# Patient Record
Sex: Male | Born: 1990 | Race: White | Hispanic: No | Marital: Single | Smoking: Current every day smoker
Health system: Southern US, Community
[De-identification: ages and names within clinical notes are randomized; demographics above are authoritative.]

## PROBLEM LIST (undated history)

## (undated) DIAGNOSIS — R011 Cardiac murmur, unspecified: Secondary | ICD-10-CM

---
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
## 2011-04-29 IMAGING — CR DG ANKLE COMPLETE 3+V*R*
3 series · 3 of 3 positions shown · non-contrast
Comparison: None.

CLINICAL DATA: Injury to right ankle while playing basketball;
right lateral malleolar swelling and pain at the distal aspect of
the right lower leg.

RIGHT ANKLE - COMPLETE 3+ VIEW

[view not recorded (1 of 3)]
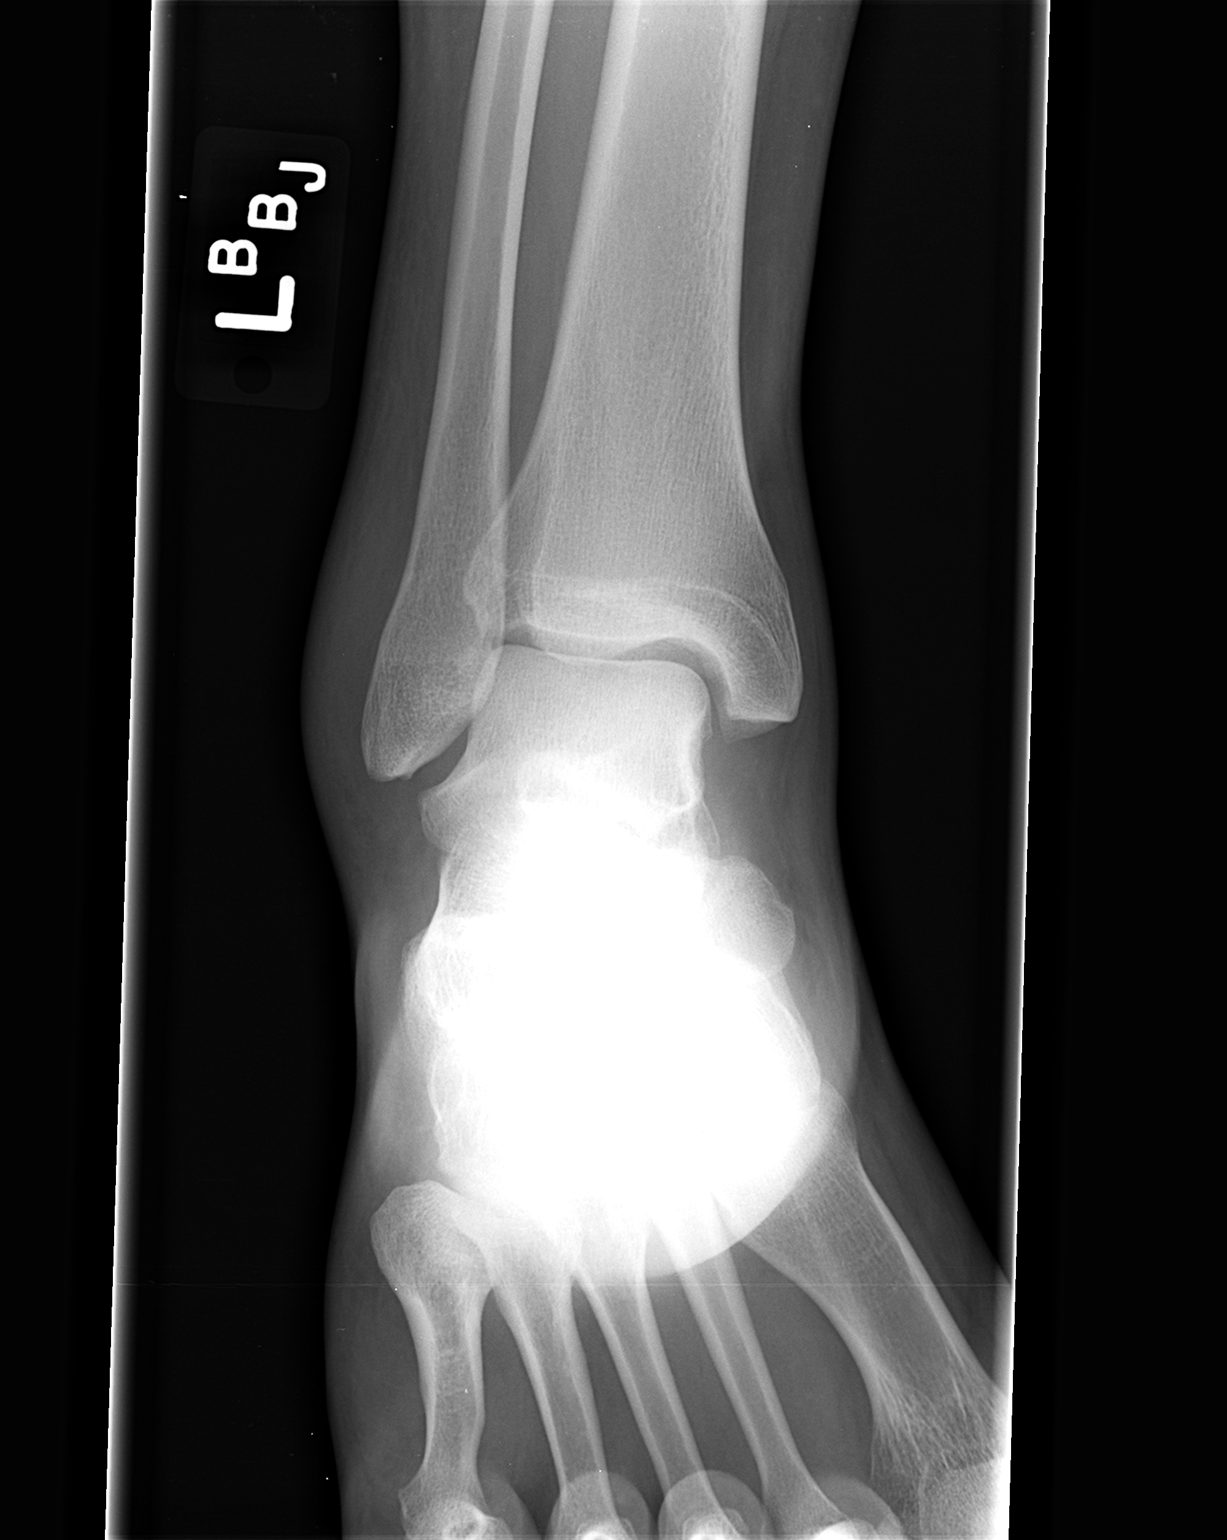

[view not recorded (2 of 3)]
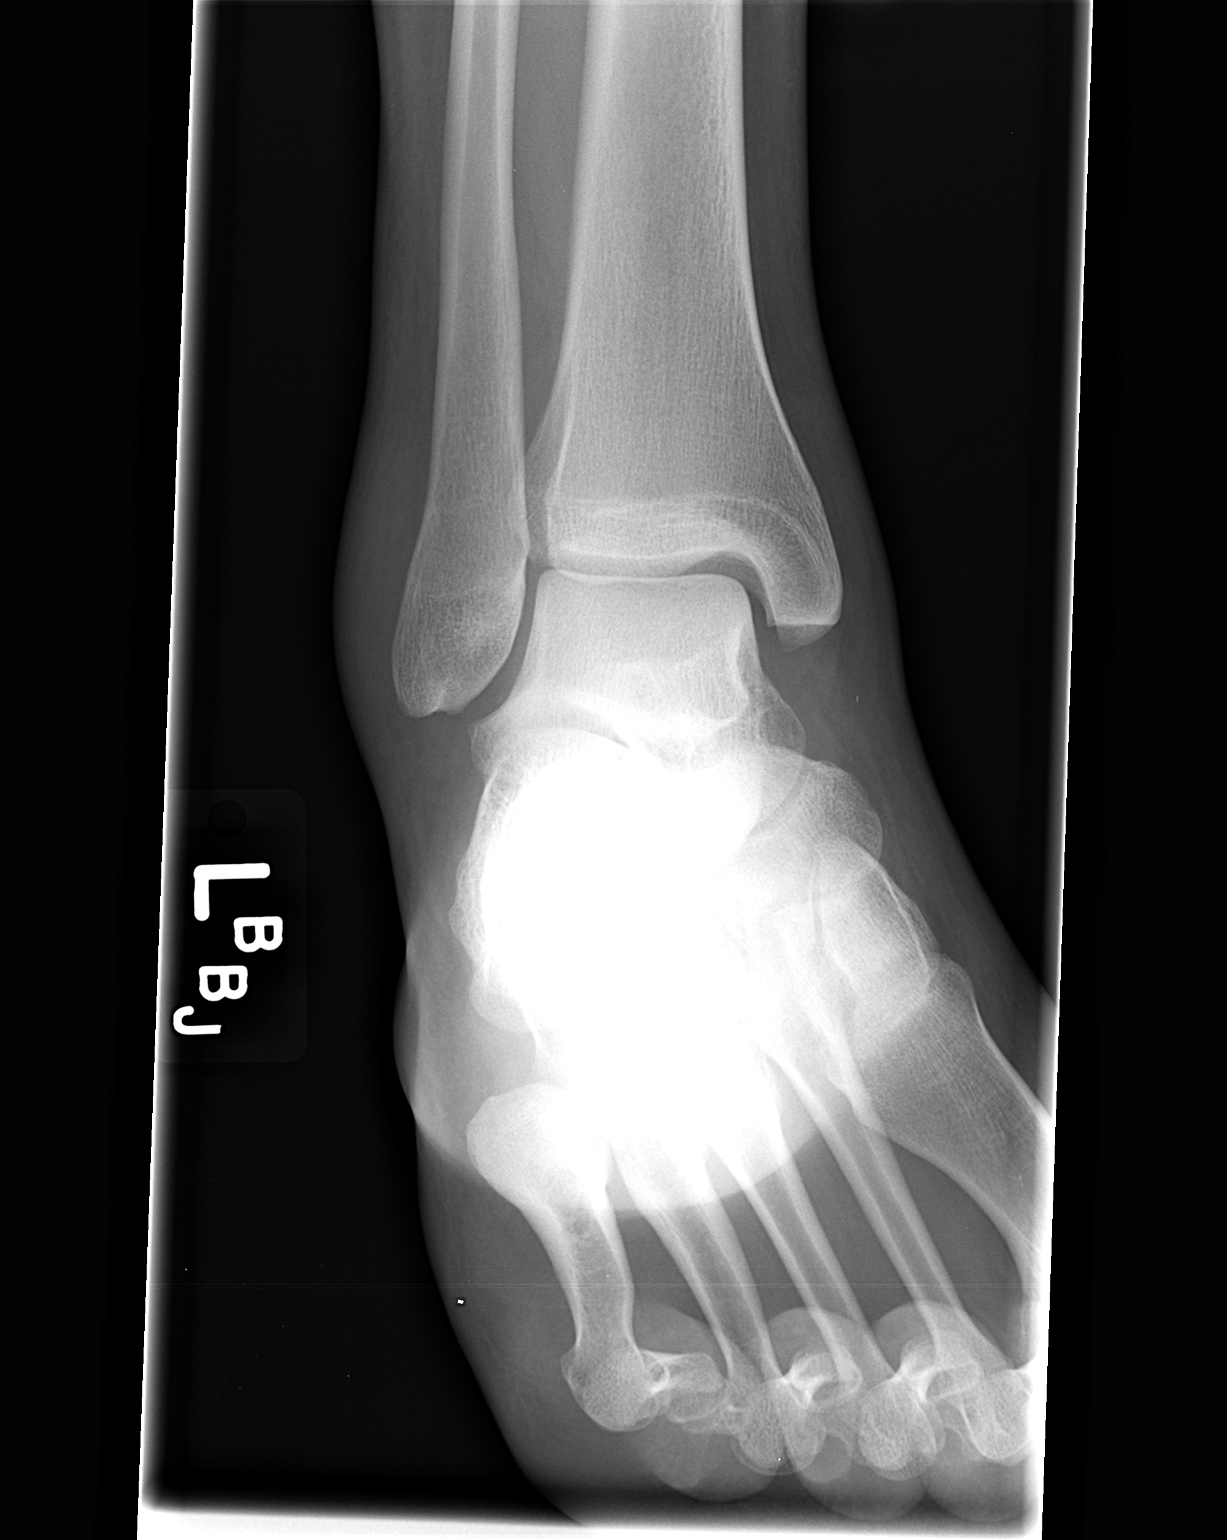

[view not recorded (3 of 3)]
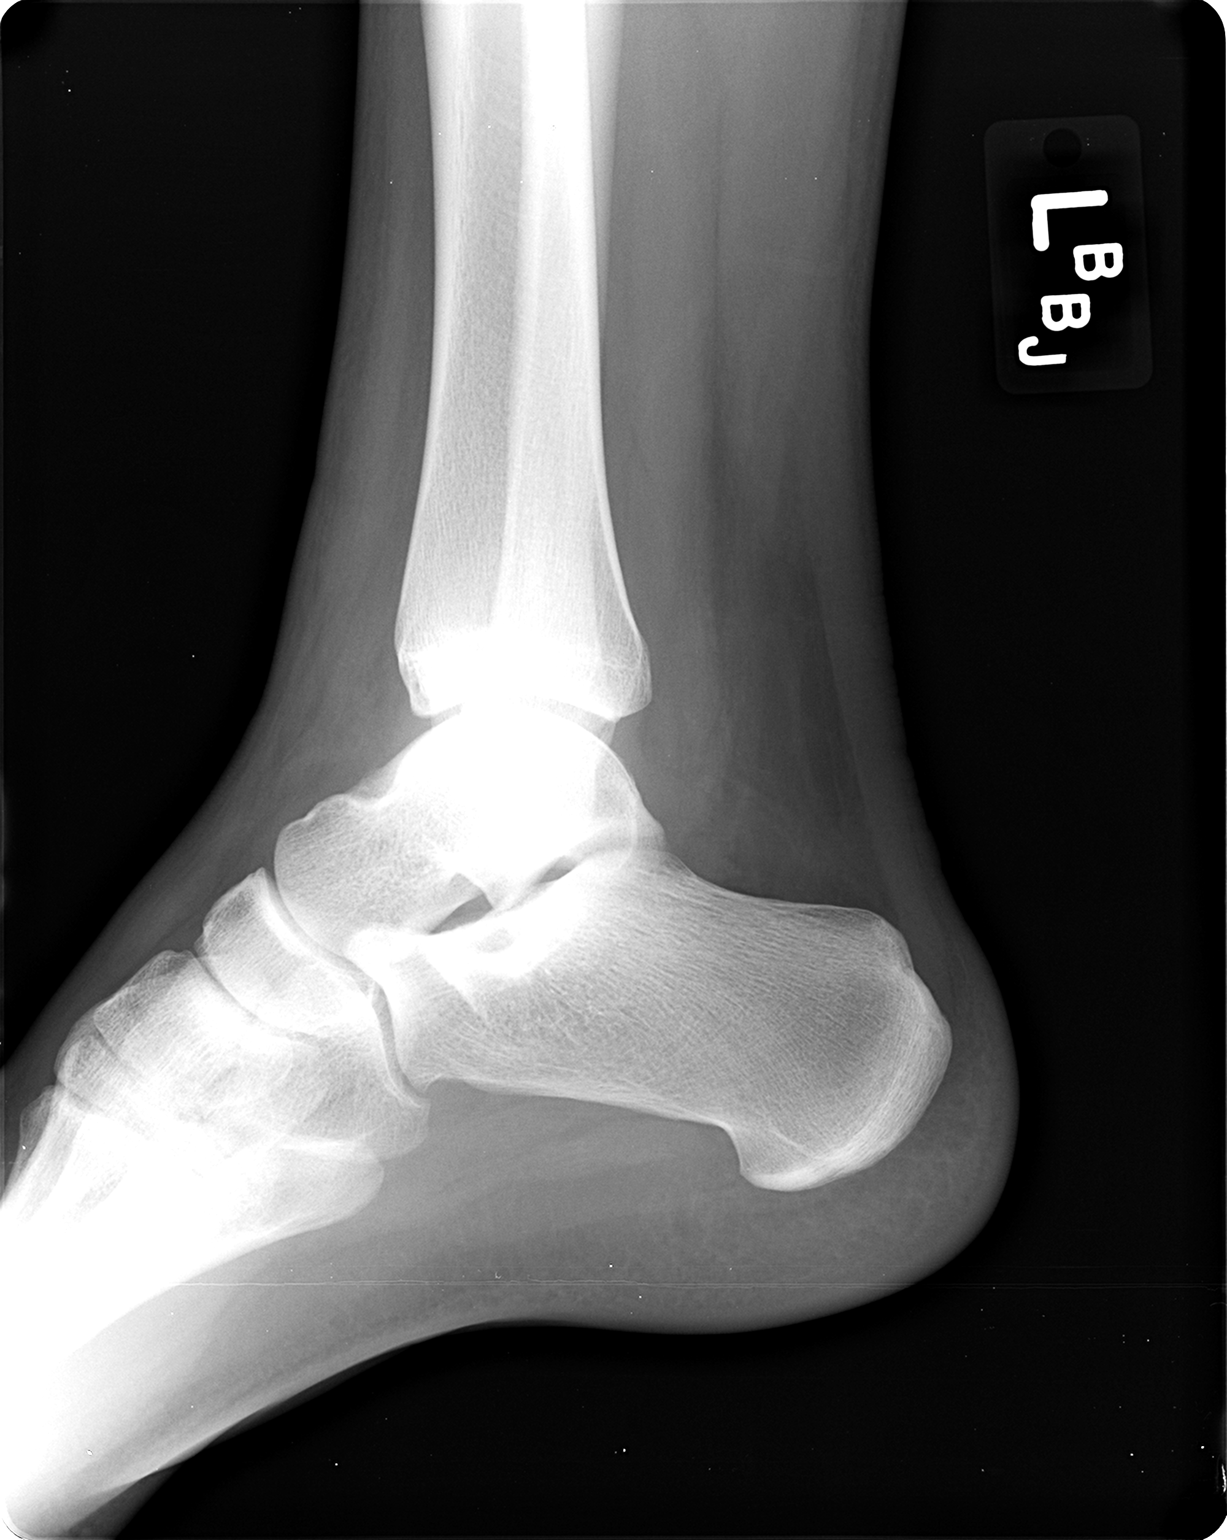

[3 of 3 positions shown; findings below may reference images not displayed]

FINDINGS: There is no evidence of fracture or dislocation.  The
ankle mortise is intact; the interosseous space is within normal
limits.  No talar tilt or subluxation is seen.

The joint spaces are preserved.  Lateral soft tissue swelling is
seen.
IMPRESSION: No evidence of fracture or dislocation.

## 2011-09-14 ENCOUNTER — Emergency Department (HOSPITAL_COMMUNITY): Payer: Self-pay

## 2011-09-14 ENCOUNTER — Encounter (HOSPITAL_COMMUNITY): Payer: Self-pay | Admitting: *Deleted

## 2011-09-14 ENCOUNTER — Emergency Department (HOSPITAL_COMMUNITY)
Admission: EM | Admit: 2011-09-14 | Discharge: 2011-09-14 | Disposition: A | Discharge status: Home / Self Care | Payer: Self-pay | Attending: Emergency Medicine | Admitting: Emergency Medicine

## 2011-09-14 DIAGNOSIS — S62319A Displaced fracture of base of unspecified metacarpal bone, initial encounter for closed fracture: Secondary | ICD-10-CM | POA: Insufficient documentation

## 2011-09-14 DIAGNOSIS — S6290XA Unspecified fracture of unspecified wrist and hand, initial encounter for closed fracture: Secondary | ICD-10-CM

## 2011-09-14 DIAGNOSIS — F17200 Nicotine dependence, unspecified, uncomplicated: Secondary | ICD-10-CM | POA: Insufficient documentation

## 2011-09-14 DIAGNOSIS — W230XXA Caught, crushed, jammed, or pinched between moving objects, initial encounter: Secondary | ICD-10-CM | POA: Insufficient documentation

## 2011-09-14 MED ORDER — NAPROXEN 500 MG PO TABS: 500.0000 mg | ORAL_TABLET | Freq: Two times a day (BID) | ORAL | Status: DC

## 2011-09-14 MED ORDER — NAPROXEN 250 MG PO TABS
500.0000 mg | ORAL_TABLET | Freq: Once | ORAL | Status: AC
  Administered 2011-09-14: 500 mg via ORAL
  Filled 2011-09-14: qty 2

## 2011-09-14 NOTE — ED Provider Notes (Signed)
History     CSN: [id number]  Arrival date & time 09/14/11  0000   First MD Initiated Contact with Patient 09/14/11 0031      Chief Complaint  Patient presents with  . Hand Injury    (Consider location/radiation/quality/duration/timing/severity/associated sxs/prior treatment) HPI  GEN CLAGG is a 21 y.o. male who presents to the Emergency Department complaining of hand pain after slamming his left hand in the door of a car around 5:30 PM. Has taken no medicines. Pain and swelling to the top of the hand.  History reviewed. No pertinent past medical history.  History reviewed. No pertinent past surgical history.  History reviewed. No pertinent family history.  History  Substance Use Topics  . Smoking status: Current Everyday Smoker -- 0.5 packs/day    Types: Cigarettes  . Smokeless tobacco: Not on file  . Alcohol Use: No      Review of Systems  Constitutional: Negative for fever.       10 Systems reviewed and are negative for acute change except as noted in the HPI.  HENT: Negative for congestion.   Eyes: Negative for discharge and redness.  Respiratory: Negative for cough and shortness of breath.   Cardiovascular: Negative for chest pain.  Gastrointestinal: Negative for vomiting and abdominal pain.  Musculoskeletal: Negative for back pain.       Left hand pain and swelling  Skin: Negative for rash.  Neurological: Negative for syncope, numbness and headaches.  Psychiatric/Behavioral:       No behavior change.    Allergies  Review of patient's allergies indicates no known allergies.  Home Medications  No current outpatient prescriptions on file.  BP 136/71  Pulse 101  Temp 97.9 F (36.6 C) (Oral)  Resp 16  Ht 5\' 10"  (1.778 m)  Wt 180 lb (81.647 kg)  BMI 25.83 kg/m2  SpO2 99%  Physical Exam  Nursing note and vitals reviewed. Constitutional: He appears well-developed and well-nourished.       Awake, alert, nontoxic appearance.  HENT:  Head:  Atraumatic.  Eyes: Right eye exhibits no discharge. Left eye exhibits no discharge.  Neck: Neck supple.  Cardiovascular: Normal heart sounds.   Pulmonary/Chest: Effort normal and breath sounds normal. He exhibits no tenderness.  Abdominal: Soft. There is no tenderness. There is no rebound.  Musculoskeletal: He exhibits no tenderness.       Baseline ROM, no obvious new focal weakness. Left hand with swelling to the dorsum and lateral side. Tenderness with palpation. Able to move all fingers, Cap refill brisk.  Neurological:       Mental status and motor strength appears baseline for patient and situation.  Skin: No rash noted.  Psychiatric: He has a normal mood and affect.    ED Course  Procedures (including critical care time)  Dg Hand Complete Left  09/14/2011  *RADIOLOGY REPORT*  Clinical Data: Slammed hand in car door.  Pain and swelling.  LEFT HAND - COMPLETE 3+ VIEW  Comparison: None.  Findings: A comminuted, nondisplaced fracture is seen involving the base of the fourth metacarpal. There is also fracture of the posterior hamate, with posterior subluxation of the base of the fifth metacarpal.  Associated soft tissue swelling is also seen.  IMPRESSION:  1.  Comminuted nondisplaced fracture of the base of the fourth metacarpal. 2.  Fracture of the posterior hamate, with posterior subluxation of the fifth metacarpal base.   Original Report Authenticated By: Danae Orleans, M.D.      No diagnosis  found.    MDM  Patient slammed left hand in the door of a car. Xrays show fractures of the hand. Placed in enlarged ulnar gutter splint. Referral to orthopedist for follow up.Dx testing d/w pt. Given a paper copy of his hand film.  Questions answered.  Verb understanding, agreeable to d/c home with outpt f/u.Pt stable in ED with no significant deterioration in condition.The patient appears reasonably screened and/or stabilized for discharge and I doubt any other medical condition or other [facility]  requiring further screening, evaluation, or treatment in the ED at this time prior to discharge.  MDM Reviewed: nursing note and vitals Reviewed previous: x-ray            Nicoletta Dress. Colon Branch, MD 09/14/11 [phone]

## 2011-09-14 NOTE — ED Notes (Signed)
Patient with left hand injury from slamming it on a car door around 5pm today.  Patient's left hand is swollen-able move fingers and feel fingers.

## 2011-09-14 NOTE — ED Notes (Signed)
Patient back from radiology.

## 2011-09-16 ENCOUNTER — Telehealth: Payer: Self-pay | Admitting: Orthopedic Surgery

## 2011-09-16 ENCOUNTER — Telehealth: Payer: Self-pay | Admitting: Oral Surgery

## 2011-09-16 NOTE — Telephone Encounter (Signed)
NO OTHER NOTE - see [facility] note under Dr. Vickki Hearing.

## 2011-09-16 NOTE — Telephone Encounter (Signed)
Patient called for appointment for hand injury, treated at [facility] Emergency Room 09/14/11.  I offered appointment; patient gave information, including self-pay; advised; patient opted to hold and call back. I again offered appointment and told him we would be happy to see him, work things out as best as possible.  He stated he would call back.

## 2011-10-03 NOTE — Telephone Encounter (Signed)
No further calls received after attempts to reach.

## 2012-07-17 ENCOUNTER — Encounter (HOSPITAL_COMMUNITY): Payer: Self-pay | Admitting: *Deleted

## 2012-07-17 ENCOUNTER — Emergency Department (HOSPITAL_COMMUNITY)
Admission: EM | Admit: 2012-07-17 | Discharge: 2012-07-17 | Disposition: A | Discharge status: Home / Self Care | Payer: Self-pay | Attending: Emergency Medicine | Admitting: Emergency Medicine

## 2012-07-17 DIAGNOSIS — F17200 Nicotine dependence, unspecified, uncomplicated: Secondary | ICD-10-CM | POA: Insufficient documentation

## 2012-07-17 DIAGNOSIS — K0889 Other specified disorders of teeth and supporting structures: Secondary | ICD-10-CM

## 2012-07-17 DIAGNOSIS — K089 Disorder of teeth and supporting structures, unspecified: Secondary | ICD-10-CM | POA: Insufficient documentation

## 2012-07-17 MED ORDER — AMOXICILLIN 500 MG PO CAPS: 500.0000 mg | ORAL_CAPSULE | Freq: Three times a day (TID) | ORAL | Status: DC

## 2012-07-17 MED ORDER — HYDROCODONE-ACETAMINOPHEN 5-325 MG PO TABS: ORAL_TABLET | ORAL | Status: DC

## 2012-07-17 NOTE — ED Notes (Signed)
Dental pain with swelling to left side since yesterday.

## 2012-07-17 NOTE — ED Provider Notes (Signed)
Medical screening examination/treatment/procedure(s) were performed by non-physician practitioner and as supervising physician I was immediately available for consultation/collaboration.   Ziare Orrick L Admiral Marcucci, MD 07/17/12 1311 

## 2012-07-17 NOTE — ED Notes (Signed)
Dental pain, rt lower molar since yesterday.  Appears to have erupting molar.

## 2012-07-17 NOTE — ED Provider Notes (Signed)
History    CSN: [id number] Arrival date & time 07/17/12  1023  First MD Initiated Contact with Patient 07/17/12 1031     Chief Complaint  Patient presents with  . Dental Pain   (Consider location/radiation/quality/duration/timing/severity/associated sxs/prior Treatment) HPI Comments: Darin Dudley is a 22 y.o. male who presents to the Emergency Department complaining of tooth pain that began yesterday.  This morning, woke up with facial swelling.  States pain is worse with hot or cold foods.  He denies fever, difficulty swallowing or difficulty opening or closing his mouth,  He states he does not have a dentist.     Patient is a 22 y.o. male presenting with tooth pain.  Dental Pain Associated symptoms: no congestion, no facial swelling, no fever, no headaches and no neck pain    History reviewed. No pertinent past medical history. History reviewed. No pertinent past surgical history. No family history on file. History  Substance Use Topics  . Smoking status: Current Every Day Smoker -- 0.50 packs/day    Types: Cigarettes  . Smokeless tobacco: Not on file  . Alcohol Use: No    Review of Systems  Constitutional: Negative for fever and appetite change.  HENT: Positive for dental problem. Negative for congestion, sore throat, facial swelling, trouble swallowing, neck pain and neck stiffness.   Eyes: Negative for pain and visual disturbance.  Neurological: Negative for dizziness, facial asymmetry and headaches.  Hematological: Negative for adenopathy.  All other systems reviewed and are negative.    Allergies  Review of patient's allergies indicates no known allergies.  Home Medications   Current Outpatient Rx  Name  Route  Sig  Dispense  Refill  . benzocaine (ORAJEL) 10 % mucosal gel   Mouth/Throat   Use as directed 1 application in the mouth or throat as needed for pain.          BP 130/82  Pulse 79  Temp(Src) 98.2 F (36.8 C) (Oral)  Resp 16  Ht 5\' 11"   (1.803 m)  Wt 185 lb (83.915 kg)  BMI 25.81 kg/m2  SpO2 99% Physical Exam  Nursing note and vitals reviewed. Constitutional: He is oriented to person, place, and time. He appears well-developed and well-nourished. No distress.  HENT:  Head: Normocephalic and atraumatic. No trismus in the jaw.  Right Ear: Tympanic membrane and ear canal normal.  Left Ear: Tympanic membrane and ear canal normal.  Mouth/Throat: Uvula is midline, oropharynx is clear and moist and mucous membranes are normal. Dental caries present. No dental abscesses or edematous.    ttp of the left lower third molar and surrounding gums.  Mild STS along the left lower jaw line. No trismus , erythema or sublingual abmnl.  Molar appears slightly impacted.  Neck: Normal range of motion. Neck supple.  Cardiovascular: Normal rate, regular rhythm and normal heart sounds.   No murmur heard. Pulmonary/Chest: Effort normal and breath sounds normal. No respiratory distress.  Musculoskeletal: Normal range of motion.  Lymphadenopathy:    He has no cervical adenopathy.  Neurological: He is alert and oriented to person, place, and time. He exhibits normal muscle tone. Coordination normal.  Skin: Skin is warm and dry.    ED Course  Procedures (including critical care time) Labs Reviewed - No data to display   MDM     Patient has ttp of the left lower third molar and surrounding gums.  Mild left facial edema.  No erythema, trismus or obvious fluctuance of the gums at this  time.  Likely early dental abscess vs impaction of tooth.  Pt agrees to warm salt water rinses, vicodin for pain, amoxil, and close f/u with a dentist.    Leonidas Boateng L. Trishelle Devora, PA-C 07/17/12 1100

## 2012-07-27 ENCOUNTER — Encounter (HOSPITAL_COMMUNITY): Payer: Self-pay | Admitting: *Deleted

## 2012-07-27 ENCOUNTER — Emergency Department (HOSPITAL_COMMUNITY)
Admission: EM | Admit: 2012-07-27 | Discharge: 2012-07-27 | Disposition: A | Discharge status: Home / Self Care | Payer: Self-pay | Attending: Emergency Medicine | Admitting: Emergency Medicine

## 2012-07-27 DIAGNOSIS — F17200 Nicotine dependence, unspecified, uncomplicated: Secondary | ICD-10-CM | POA: Insufficient documentation

## 2012-07-27 DIAGNOSIS — R1310 Dysphagia, unspecified: Secondary | ICD-10-CM | POA: Insufficient documentation

## 2012-07-27 DIAGNOSIS — L539 Erythematous condition, unspecified: Secondary | ICD-10-CM | POA: Insufficient documentation

## 2012-07-27 DIAGNOSIS — J039 Acute tonsillitis, unspecified: Secondary | ICD-10-CM | POA: Insufficient documentation

## 2012-07-27 DIAGNOSIS — R5381 Other malaise: Secondary | ICD-10-CM | POA: Insufficient documentation

## 2012-07-27 DIAGNOSIS — R011 Cardiac murmur, unspecified: Secondary | ICD-10-CM | POA: Insufficient documentation

## 2012-07-27 DIAGNOSIS — R509 Fever, unspecified: Secondary | ICD-10-CM | POA: Insufficient documentation

## 2012-07-27 HISTORY — DX: Cardiac murmur, unspecified: R01.1

## 2012-07-27 LAB — RAPID STREP SCREEN (MED CTR MEBANE ONLY): Streptococcus, Group A Screen (Direct): NEGATIVE

## 2012-07-27 MED ORDER — AZITHROMYCIN 250 MG PO TABS
500.0000 mg | ORAL_TABLET | Freq: Once | ORAL | Status: DC
  Filled 2012-07-27: qty 2

## 2012-07-27 MED ORDER — AZITHROMYCIN 250 MG PO TABS: 250.0000 mg | ORAL_TABLET | Freq: Every day | ORAL | Status: DC

## 2012-07-27 MED ORDER — IBUPROFEN 600 MG PO TABS: 600.0000 mg | ORAL_TABLET | Freq: Four times a day (QID) | ORAL | Status: DC | PRN

## 2012-07-27 NOTE — ED Notes (Signed)
Called for pt x 1.  

## 2012-07-27 NOTE — ED Notes (Signed)
Sore throat since Saturday, Exudate on tonsils, Says temp was 103.2 last night

## 2012-07-28 NOTE — ED Provider Notes (Signed)
History    CSN: [id number] Arrival date & time 07/27/12  1133  First MD Initiated Contact with Patient 07/27/12 1206     Chief Complaint  Patient presents with  . Sore Throat   (Consider location/radiation/quality/duration/timing/severity/associated sxs/prior Treatment) Patient is a 22 y.o. male presenting with pharyngitis. The history is provided by the patient.  Sore Throat This is a new problem. Episode onset: 2 days. The problem occurs constantly. The problem has been gradually worsening. Associated symptoms include fatigue, a fever and a sore throat. Pertinent negatives include no abdominal pain, anorexia, arthralgias, chest pain, congestion, coughing, headaches, joint swelling, myalgias, nausea, neck pain, numbness, rash, vomiting or weakness. Associated symptoms comments: He reports fever to 103.2 last night.  His last dose of tylenol was taken 15 hours ago.. The symptoms are aggravated by swallowing. He has tried acetaminophen and rest for the symptoms. The treatment provided no relief.   Past Medical History  Diagnosis Date  . Heart murmur    History reviewed. No pertinent past surgical history. History reviewed. No pertinent family history. History  Substance Use Topics  . Smoking status: Current Every Day Smoker -- 0.50 packs/day    Types: Cigarettes  . Smokeless tobacco: Not on file  . Alcohol Use: No    Review of Systems  Constitutional: Positive for fever and fatigue.  HENT: Positive for sore throat and trouble swallowing. Negative for ear pain, congestion, facial swelling, rhinorrhea, neck pain, neck stiffness and voice change.   Eyes: Negative.   Respiratory: Negative for cough, chest tightness and shortness of breath.   Cardiovascular: Negative for chest pain.  Gastrointestinal: Negative for nausea, vomiting, abdominal pain and anorexia.  Genitourinary: Negative.   Musculoskeletal: Negative for myalgias, joint swelling and arthralgias.  Skin: Negative.   Negative for rash and wound.  Neurological: Negative for dizziness, weakness, light-headedness, numbness and headaches.  Psychiatric/Behavioral: Negative.     Allergies  Review of patient's allergies indicates no known allergies.  Home Medications   Current Outpatient Rx  Name  Route  Sig  Dispense  Refill  . amoxicillin (AMOXIL) 500 MG capsule   Oral   Take 1 capsule (500 mg total) by mouth 3 (three) times daily.   30 capsule   0   . benzocaine (ORAJEL) 10 % mucosal gel   Mouth/Throat   Use as directed 1 application in the mouth or throat as needed for pain.         Marland Kitchen HYDROcodone-acetaminophen (NORCO/VICODIN) 5-325 MG per tablet      Take one-two tabs po q 4-6 hrs prn pain   15 tablet   0   . azithromycin (ZITHROMAX Z-PAK) 250 MG tablet   Oral   Take 1 tablet (250 mg total) by mouth daily.   4 tablet   0   . ibuprofen (ADVIL,MOTRIN) 600 MG tablet   Oral   Take 1 tablet (600 mg total) by mouth every 6 (six) hours as needed for pain.   30 tablet   0    BP 124/65  Pulse 93  Temp(Src) 99.4 F (37.4 C) (Oral)  SpO2 100% Physical Exam  Constitutional: He is oriented to person, place, and time. He appears well-developed and well-nourished.  HENT:  Head: Normocephalic and atraumatic. No trismus in the jaw.  Right Ear: Tympanic membrane, external ear and ear canal normal.  Left Ear: Tympanic membrane, external ear and ear canal normal.  Nose: No mucosal edema or rhinorrhea.  Mouth/Throat: Uvula is midline and mucous membranes  are normal. Mucous membranes are not dry. No edematous. Oropharyngeal exudate, posterior oropharyngeal edema and posterior oropharyngeal erythema present. No tonsillar abscesses.  1+ bilateral, uniformly enlarged palatine tonsils.  Eyes: Conjunctivae are normal.  Cardiovascular: Normal rate and normal heart sounds.   Pulmonary/Chest: Effort normal. No respiratory distress. He has no wheezes. He has no rales.  Abdominal: Soft. There is no  tenderness.  Musculoskeletal: Normal range of motion.  Neurological: He is alert and oriented to person, place, and time.  Skin: Skin is warm and dry. No rash noted.  Psychiatric: He has a normal mood and affect.    ED Course  Procedures (including critical care time) Labs Reviewed  RAPID STREP SCREEN  CULTURE, GROUP A STREP   No results found. 1. Acute tonsillitis     MDM  Pt with acute tonsillitis.  He has recently finished a course of amoxil for dental abscess,  So placed on zithromax,  First dose given here.  He was prescribed ibuprofen for pain relief,  Given dose here with he has tolerated PO fluids while here without difficulty.  No sign of peritonsillar abscess,  Tonsils modestly enlarged, no respiratory compromise.  Encouraged rest,  Increased fluid intake,  Return here for worsened sx.    Burgess Amor, PA-C 07/28/12 [phone]

## 2012-07-28 NOTE — ED Provider Notes (Signed)
Medical screening examination/treatment/procedure(s) were performed by non-physician practitioner and as supervising physician I was immediately available for consultation/collaboration.    Shiela Bruns D Syair Fricker, MD 07/28/12 0907 

## 2012-07-29 LAB — CULTURE, GROUP A STREP

## 2015-12-04 ENCOUNTER — Emergency Department (HOSPITAL_COMMUNITY)
Admission: EM | Admit: 2015-12-04 | Discharge: 2015-12-04 | Disposition: A | Discharge status: Home / Self Care | Payer: Self-pay | Attending: Emergency Medicine | Admitting: Emergency Medicine

## 2015-12-04 ENCOUNTER — Encounter (HOSPITAL_COMMUNITY): Payer: Self-pay | Admitting: *Deleted

## 2015-12-04 DIAGNOSIS — K0889 Other specified disorders of teeth and supporting structures: Secondary | ICD-10-CM | POA: Insufficient documentation

## 2015-12-04 DIAGNOSIS — F17210 Nicotine dependence, cigarettes, uncomplicated: Secondary | ICD-10-CM | POA: Insufficient documentation

## 2015-12-04 MED ORDER — PENICILLIN V POTASSIUM 500 MG PO TABS: 500.0000 mg | ORAL_TABLET | Freq: Four times a day (QID) | ORAL | 0 refills | Status: AC

## 2015-12-04 MED ORDER — IBUPROFEN 600 MG PO TABS: 600.0000 mg | ORAL_TABLET | Freq: Three times a day (TID) | ORAL | 0 refills | Status: DC | PRN

## 2015-12-04 MED ORDER — IBUPROFEN 800 MG PO TABS
800.0000 mg | ORAL_TABLET | Freq: Once | ORAL | Status: AC
  Administered 2015-12-04: 800 mg via ORAL
  Filled 2015-12-04: qty 1

## 2015-12-04 NOTE — ED Notes (Signed)
Pt alert & oriented x4, stable gait. Patient given discharge instructions, paperwork & prescription(s). Registration completed in room.  Patient verbalized understanding. Pt left department w/ no further questions. 

## 2015-12-04 NOTE — ED Provider Notes (Signed)
  [facility]-EMERGENCY DEPT Provider Note   CSN: [id number] Arrival date & time: 12/04/15  0541     History   Chief Complaint Chief Complaint  Patient presents with  . Dental Pain    HPI Darin Dudley is a 25 y.o. male.  The history is provided by the patient.  Dental Pain   This is a new problem. The current episode started 12 to 24 hours ago. The problem occurs constantly. The problem has been gradually worsening. The pain is severe. He has tried acetaminophen for the symptoms. The treatment provided no relief.    Past Medical History:  Diagnosis Date  . Heart murmur     There are no active problems to display for this patient.   History reviewed. No pertinent surgical history.     Home Medications    Prior to Admission medications   Medication Sig Start Date End Date Taking? Authorizing Provider  ibuprofen (ADVIL,MOTRIN) 600 MG tablet Take 1 tablet (600 mg total) by mouth every 8 (eight) hours as needed for moderate pain. 12/04/15   Zadie Rhineonald Reilly Blades, MD  penicillin v potassium (VEETID) 500 MG tablet Take 1 tablet (500 mg total) by mouth 4 (four) times daily. 12/04/15 12/11/15  Zadie Rhineonald Halley Shepheard, MD    Family History No family history on file.  Social History Social History  Substance Use Topics  . Smoking status: Current Every Day Smoker    Packs/day: 0.50    Types: Cigarettes  . Smokeless tobacco: Never Used  . Alcohol use No     Allergies   Patient has no known allergies.   Review of Systems Review of Systems  Constitutional: Negative for fever.  Gastrointestinal: Negative for vomiting.     Physical Exam Updated Vital Signs BP 140/83   Pulse 73   Temp 97.9 F (36.6 C) (Oral)   Resp 16   Ht 5\' 11"  (1.803 m)   Wt 102.1 kg   SpO2 99%   BMI 31.38 kg/m   Physical Exam CONSTITUTIONAL: Well developed/well nourished HEAD AND FACE: Normocephalic/atraumatic EYES: EOMI/PERRL ENMT: Mucous membranes moist.  Poor dentition.  No trismus.  No focal  abscess noted. Left lower molar decayed NECK: supple no meningeal signs CV: S1/S2 noted LUNGS: Lungs are clear to auscultation bilaterally, no apparent distress ABDOMEN: soft, nontender, no rebound or guarding NEURO: Pt is awake/alert, moves all extremitiesx4 EXTREMITIES:full ROM SKIN: warm, color normal   ED Treatments / Results  Labs (all labs ordered are listed, but only abnormal results are displayed) Labs Reviewed - No data to display  EKG  EKG Interpretation None       Radiology No results found.  Procedures Procedures (including critical care time)  Medications Ordered in ED Medications  ibuprofen (ADVIL,MOTRIN) tablet 800 mg (800 mg Oral Given 12/04/15 0617)     Initial Impression / Assessment and Plan / ED Course  I have reviewed the triage vital signs and the nursing notes.    Clinical Course       Final Clinical Impressions(s) / ED Diagnoses   Final diagnoses:  Pain, dental    New Prescriptions Discharge Medication List as of 12/04/2015  6:13 AM    START taking these medications   Details  penicillin v potassium (VEETID) 500 MG tablet Take 1 tablet (500 mg total) by mouth 4 (four) times daily., Starting Mon 12/04/2015, Until Mon 12/11/2015, Print         Zadie Rhineonald Kaydin Labo, MD 12/04/15 0630

## 2015-12-04 NOTE — ED Triage Notes (Signed)
Pt c/o dental pain that started yesterday.

## 2017-04-26 ENCOUNTER — Emergency Department (HOSPITAL_COMMUNITY)
Admission: EM | Admit: 2017-04-26 | Discharge: 2017-04-26 | Disposition: A | Discharge status: Home / Self Care | Payer: Self-pay | Attending: Emergency Medicine | Admitting: Emergency Medicine

## 2017-04-26 ENCOUNTER — Encounter (HOSPITAL_COMMUNITY): Payer: Self-pay | Admitting: Emergency Medicine

## 2017-04-26 DIAGNOSIS — O161 Unspecified maternal hypertension, first trimester: Secondary | ICD-10-CM

## 2017-04-26 DIAGNOSIS — F17210 Nicotine dependence, cigarettes, uncomplicated: Secondary | ICD-10-CM | POA: Insufficient documentation

## 2017-04-26 DIAGNOSIS — R030 Elevated blood-pressure reading, without diagnosis of hypertension: Secondary | ICD-10-CM | POA: Insufficient documentation

## 2017-04-26 DIAGNOSIS — K0889 Other specified disorders of teeth and supporting structures: Secondary | ICD-10-CM

## 2017-04-26 DIAGNOSIS — Z8679 Personal history of other diseases of the circulatory system: Secondary | ICD-10-CM | POA: Insufficient documentation

## 2017-04-26 DIAGNOSIS — H9203 Otalgia, bilateral: Secondary | ICD-10-CM | POA: Insufficient documentation

## 2017-04-26 MED ORDER — LIDOCAINE VISCOUS 2 % MT SOLN: 10.0000 mL | OROMUCOSAL | 0 refills | Status: AC | PRN

## 2017-04-26 MED ORDER — PENICILLIN V POTASSIUM 500 MG PO TABS: 500.0000 mg | ORAL_TABLET | Freq: Four times a day (QID) | ORAL | 0 refills | Status: AC

## 2017-04-26 MED ORDER — CHLORHEXIDINE GLUCONATE 0.12% ORAL RINSE (MEDLINE KIT): 10.0000 mL | Freq: Two times a day (BID) | OROMUCOSAL | 0 refills | Status: AC

## 2017-04-26 MED ORDER — IBUPROFEN 600 MG PO TABS: 600.0000 mg | ORAL_TABLET | Freq: Four times a day (QID) | ORAL | 0 refills | Status: AC | PRN

## 2017-04-26 MED ORDER — PENICILLIN V POTASSIUM 250 MG PO TABS: 500.0000 mg | ORAL_TABLET | Freq: Once | ORAL | Status: DC

## 2017-04-26 MED ORDER — ACETAMINOPHEN 500 MG PO TABS: 1000.0000 mg | ORAL_TABLET | Freq: Once | ORAL | Status: DC

## 2017-04-26 NOTE — ED Triage Notes (Signed)
Patient complains of wisdom teeth pain bilaterally and bilateral ear pain x 3 weeks. Pt states he has been taking tylenol and ibuprofen at home with some relief at first but now provides no pain relief.

## 2017-04-26 NOTE — ED Provider Notes (Signed)
[facility] EMERGENCY DEPARTMENT Provider Note   CSN: [id number] Arrival date & time: 04/26/17  1516     History   Chief Complaint Chief Complaint  Patient presents with  . Dental Pain  . Otalgia    HPI Darin Dudley is a 27 y.o. male.  HPI   Darin Dudley is a 27 y.o. male who presents to the Emergency Department complaining of persistent, gradually worsening, bilateral-sided, upper and lower, posterior dental pain beginning 2 weeks ago. Pt describes their pain as throbbing. Pt has been taking ibuprofen at home with minimal relief of pain. Pain is exacerbated by heat/cold and chewing. They are not currently followed by dentistry.  Pt denies facial swelling, fever, chills, difficulty breathing, difficulty swallowing.  Patient also reporting he has had bilateral ear pain, and that over-the-counter eardrops have relieved some of that pain.  No history of immunocompromised state.  Past Medical History:  Diagnosis Date  . Heart murmur     There are no active problems to display for this patient.   History reviewed. No pertinent surgical history.      Home Medications    Prior to Admission medications   Medication Sig Start Date End Date Taking? Authorizing Provider  ibuprofen (ADVIL,MOTRIN) 600 MG tablet Take 1 tablet (600 mg total) by mouth every 8 (eight) hours as needed for moderate pain. 12/04/15   Ripley Fraise, MD    Family History No family history on file.  Social History Social History   Tobacco Use  . Smoking status: Current Every Day Smoker    Packs/day: 0.50    Types: Cigarettes  . Smokeless tobacco: Never Used  Substance Use Topics  . Alcohol use: No  . Drug use: No     Allergies   Patient has no known allergies.   Review of Systems Review of Systems  Constitutional: Negative for chills and fever.  HENT: Positive for dental problem and ear pain. Negative for postnasal drip, rhinorrhea and trouble swallowing.   Respiratory: Negative for  stridor.   Gastrointestinal: Negative for nausea and vomiting.     Physical Exam Updated Vital Signs BP (!) 179/111 (BP Location: Left Arm)   Pulse 68   Temp 97.7 F (36.5 C) (Oral)   Resp 16   Ht 6' (1.829 m)   Wt 113.4 kg (250 lb)   SpO2 100%   BMI 33.91 kg/m   Physical Exam  Constitutional: He appears well-developed and well-nourished. No distress.  Sitting comfortably in bed.  HENT:  Head: Normocephalic and atraumatic.  Mouth/Throat:    Dental cavities and poor oral dentition noted. Pain along teeth as depicted in image. No abscess noted. Midline uvula. No trismus. OP clear and moist. No oropharyngeal erythema or edema. Neck supple with no tenderness. No facial edema.  Eyes: Conjunctivae are normal. Right eye exhibits no discharge. Left eye exhibits no discharge.  EOMs normal to gross examination.  Neck: Normal range of motion.  Cardiovascular: Normal rate and regular rhythm.  Intact, 2+ radial pulse.  Pulmonary/Chest:  Normal respiratory effort. Patient converses comfortably. No audible wheeze or stridor.  Abdominal: He exhibits no distension.  Musculoskeletal: Normal range of motion.  Neurological: He is alert.  Cranial nerves intact to gross observation. Patient moves extremities without difficulty.  Skin: Skin is warm and dry. He is not diaphoretic.  Psychiatric: He has a normal mood and affect. His behavior is normal. Judgment and thought content normal.  Nursing note and vitals reviewed.    ED  Treatments / Results  Labs (all labs ordered are listed, but only abnormal results are displayed) Labs Reviewed - No data to display  EKG None  Radiology No results found.  Procedures Procedures (including critical care time)  Medications Ordered in ED Medications  penicillin v potassium (VEETID) tablet 500 mg (has no administration in time range)  acetaminophen (TYLENOL) tablet 1,000 mg (has no administration in time range)     Initial Impression /  Assessment and Plan / ED Course  I have reviewed the triage vital signs and the nursing notes.  Pertinent labs & imaging results that were available during my care of the patient were reviewed by me and considered in my medical decision making (see chart for details).     Darin Dudley is a 27 y.o. male who presents to ED for dental pain. No abscess requiring immediate incision and drainage. Patient is afebrile, non toxic appearing, and swallowing secretions well. Exam not concerning for Ludwig's angina or pharyngeal abscess. Will treat with PCN, chlorhexidine rinse, and viscous lidocaine. I provided dental resource guide and stressed the importance of dental follow up for ultimate management of dental pain. Patient voices understanding and is agreeable to plan.  Blood pressure elevated today.  I discussed with patient.  Patient given multiple resources for follow-up and have a recheck.  No evidence of hypertensive urgency or emergency today.    Final Clinical Impressions(s) / ED Diagnoses   Final diagnoses:  Pain, dental  Elevated blood pressure affecting pregnancy in first trimester, antepartum    ED Discharge Orders        Ordered    penicillin v potassium (VEETID) 500 MG tablet  4 times daily     04/26/17 1626    chlorhexidine gluconate, MEDLINE KIT, (PERIDEX) 0.12 % solution  2 times daily     04/26/17 1626    lidocaine (XYLOCAINE) 2 % solution  As needed     04/26/17 1626    ibuprofen (ADVIL,MOTRIN) 600 MG tablet  Every 6 hours PRN     04/26/17 1626       Albesa Seen, PA-C 04/26/17 [place], [place], DO 04/27/17 1747

## 2017-04-26 NOTE — Discharge Instructions (Addendum)
Please see the information and instructions below regarding your visit.  Your diagnoses today include:  1. Pain, dental    You have a dental injury/infection. It is very important that you get evaluated by a dentist as soon as possible. Call tomorrow to schedule an appointment. Ibuprofen as needed for pain.  He may take ibuprofen 600 mg every 6 hours as needed for pain.  In between, Tylenol 650 mg.  Do not exceed 4 g of Tylenol in 1 day.  Take your full course of antibiotics. Read the instructions below.  Tests performed today include: See side panel of your discharge paperwork for testing performed today. Vital signs are listed at the bottom of these instructions.   Medications prescribed:    Take any prescribed medications only as prescribed, and any over the counter medications only as directed on the packaging.  1. You are prescribed Penicillin, an antibiotic. Please take all of your antibiotics until finished.   You may develop abdominal discomfort or nausea from the antibiotic. If this occurs, you may take it with food. Some patients also get diarrhea with antibiotics. You may help offset this with probiotics which you can buy or get in yogurt. Do not eat or take the probiotics until 2 hours after your antibiotic. Some women develop vaginal yeast infections after antibiotics. If you develop unusual vaginal discharge after being on this medication, please see your primary care provider.   Some people develop allergies to antibiotics. Symptoms of antibiotic allergy can be mild and include a flat rash and itching. They can also be more serious and include:  ?Hives - Hives are raised, red patches of skin that are usually very itchy.  ?Lip or tongue swelling  ?Trouble swallowing or breathing  ?Blistering of the skin or mouth.  If you have any of these serious symptoms, please seek emergency medical care immediately.  2. You are prescribed ibuprofen, a non-steroidal anti-inflammatory  agent (NSAID) for pain. You may take 600 mg every 6 hours as needed for pain. If still requiring this medication around the clock for acute pain after 10 days, please see your primary healthcare provider.  You may combine this medication with Tylenol, 650 mg every 6 hours, so you are receiving something for pain every 3 hours.  This is not a long-term medication unless under the care and direction of your primary provider. Taking this medication long-term and not under the supervision of a healthcare provider could increase the risk of stomach ulcers, kidney problems, and cardiovascular problems such as high blood pressure.   Home care instructions:  Please follow any educational materials contained in this packet.   Eat a soft or liquid diet and rinse your mouth out after meals with warm water. You should see a dentist or return here at once if you have increased swelling, increased pain or uncontrolled bleeding from the site of your injury.  Follow-up instructions: It is very important that you see a dentist as soon as possible. There is a list of dentists attached to this packet if you do not have care established with a dentist already. Please give a call to a dentist of your choice tomorrow.  Return instructions:  Please return to the Emergency Department if you experience worsening symptoms.  Please seek care if you note any of the following about your dental pain:  You have increased pain not controlled with medicines.  You have swelling around your tooth, in your face or neck.  You have bleeding which  starts, continues, or gets worse.  You have a fever >101 If you are unable to open your mouth Please return if you have any other emergent concerns.  Additional Information:   Your vital signs today were: BP (!) 179/111 (BP Location: Left Arm)    Pulse 68    Temp 97.7 F (36.5 C) (Oral)    Resp 16    Ht 6' (1.829 m)    Wt 113.4 kg (250 lb)    SpO2 100%    BMI 33.91 kg/m  If your  blood pressure (BP) was elevated on multiple readings during this visit above 130 for the top number or above 80 for the bottom number, please have this repeated by your primary care provider within one month. --------------  Thank you for allowing [place] to participate in your care today.

## 2017-04-26 NOTE — ED Notes (Addendum)
Pt with 3 week hx of wisdom tooth pain with bilateral ear pain  Has no dentist  Has been seen here previously for dental pain  Education: health department, [facility], [facility]  Good Rx card provided

## 2018-03-13 ENCOUNTER — Emergency Department (HOSPITAL_COMMUNITY)
Admission: EM | Admit: 2018-03-13 | Discharge: 2018-03-13 | Disposition: A | Discharge status: Home / Self Care | Payer: Self-pay | Attending: Emergency Medicine | Admitting: Emergency Medicine

## 2018-03-13 ENCOUNTER — Encounter (HOSPITAL_COMMUNITY): Payer: Self-pay | Admitting: *Deleted

## 2018-03-13 ENCOUNTER — Other Ambulatory Visit: Payer: Self-pay

## 2018-03-13 DIAGNOSIS — R05 Cough: Secondary | ICD-10-CM | POA: Insufficient documentation

## 2018-03-13 DIAGNOSIS — R070 Pain in throat: Secondary | ICD-10-CM | POA: Insufficient documentation

## 2018-03-13 DIAGNOSIS — Z5321 Procedure and treatment not carried out due to patient leaving prior to being seen by health care provider: Secondary | ICD-10-CM | POA: Insufficient documentation

## 2018-03-13 NOTE — ED Triage Notes (Signed)
Cough x 3 days, sore throat onset today

## 2019-01-18 ENCOUNTER — Ambulatory Visit: Admission: EM | Admit: 2019-01-18 | Discharge: 2019-01-18 | Discharge status: Absent without leave | Payer: Self-pay

## 2019-01-18 ENCOUNTER — Other Ambulatory Visit: Payer: Self-pay

## 2019-10-18 ENCOUNTER — Emergency Department (HOSPITAL_COMMUNITY)
Admission: EM | Admit: 2019-10-18 | Discharge: 2019-10-18 | Disposition: A | Discharge status: Home / Self Care | Payer: Self-pay

## 2019-10-18 ENCOUNTER — Other Ambulatory Visit: Payer: Self-pay

## 2022-04-23 DIAGNOSIS — R1031 Right lower quadrant pain: Secondary | ICD-10-CM | POA: Diagnosis not present

## 2022-04-23 DIAGNOSIS — R1110 Vomiting, unspecified: Secondary | ICD-10-CM | POA: Diagnosis not present

## 2022-04-23 DIAGNOSIS — K5732 Diverticulitis of large intestine without perforation or abscess without bleeding: Secondary | ICD-10-CM | POA: Diagnosis not present

## 2022-04-23 DIAGNOSIS — R197 Diarrhea, unspecified: Secondary | ICD-10-CM | POA: Diagnosis not present

## 2022-04-23 DIAGNOSIS — K5792 Diverticulitis of intestine, part unspecified, without perforation or abscess without bleeding: Secondary | ICD-10-CM | POA: Diagnosis not present

## 2022-04-23 DIAGNOSIS — F17210 Nicotine dependence, cigarettes, uncomplicated: Secondary | ICD-10-CM | POA: Diagnosis not present

## 2022-07-01 ENCOUNTER — Telehealth: Payer: Self-pay

## 2022-07-01 NOTE — Telephone Encounter (Signed)
Unable to reach patient after multiple attempts and no way to leave voicemail.  I could not get in touch with patient to confirm 06/25 apt with Dr. Retta Diones so pt apt rescheduled to Dr. Belva Crome next available at 08/22 at 1100.  Need updated number for patient.

## 2022-07-02 ENCOUNTER — Ambulatory Visit: Payer: Self-pay | Admitting: Urology

## 2022-07-04 ENCOUNTER — Ambulatory Visit: Payer: Self-pay | Admitting: Urology

## 2022-07-04 DIAGNOSIS — Z0189 Encounter for other specified special examinations: Secondary | ICD-10-CM | POA: Diagnosis not present

## 2022-07-04 DIAGNOSIS — Z6833 Body mass index (BMI) 33.0-33.9, adult: Secondary | ICD-10-CM | POA: Diagnosis not present

## 2022-07-04 DIAGNOSIS — Z8719 Personal history of other diseases of the digestive system: Secondary | ICD-10-CM | POA: Diagnosis not present

## 2022-07-04 DIAGNOSIS — Z139 Encounter for screening, unspecified: Secondary | ICD-10-CM | POA: Diagnosis not present

## 2022-07-04 DIAGNOSIS — E669 Obesity, unspecified: Secondary | ICD-10-CM | POA: Diagnosis not present

## 2022-07-10 ENCOUNTER — Encounter (INDEPENDENT_AMBULATORY_CARE_PROVIDER_SITE_OTHER): Payer: Self-pay | Admitting: *Deleted

## 2022-07-19 ENCOUNTER — Ambulatory Visit (INDEPENDENT_AMBULATORY_CARE_PROVIDER_SITE_OTHER): Payer: BC Managed Care – PPO | Admitting: Gastroenterology

## 2022-08-29 ENCOUNTER — Ambulatory Visit: Payer: Self-pay | Admitting: Urology
